# Patient Record
Sex: Female | Born: 1985 | Race: White | Hispanic: No
Health system: Southern US, Community
[De-identification: ages and names within clinical notes are randomized; demographics above are authoritative.]

## PROBLEM LIST (undated history)

## (undated) DIAGNOSIS — G43909 Migraine, unspecified, not intractable, without status migrainosus: Secondary | ICD-10-CM

## (undated) DIAGNOSIS — K5900 Constipation, unspecified: Secondary | ICD-10-CM

## (undated) HISTORY — PX: TONSILLECTOMY: SUR1361

## (undated) HISTORY — PX: SHOULDER SURGERY: SHX246

## (undated) HISTORY — PX: APPENDECTOMY: SHX54

---
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
## 2003-12-07 ENCOUNTER — Encounter: Admission: RE | Admit: 2003-12-07 | Discharge: 2003-12-07 | Payer: Self-pay | Admitting: Orthopedic Surgery

## 2004-03-13 ENCOUNTER — Emergency Department (HOSPITAL_COMMUNITY): Admission: EM | Admit: 2004-03-13 | Discharge: 2004-03-13 | Payer: Self-pay | Admitting: Emergency Medicine

## 2005-01-23 ENCOUNTER — Encounter: Admission: RE | Admit: 2005-01-23 | Discharge: 2005-01-23 | Payer: Self-pay | Admitting: Internal Medicine

## 2006-05-22 IMAGING — CT CT HEAD W/O CM
1 of 2 series · 14 of 30 positions shown, 18 images · non-contrast
Comparison: none

DUPLICATE COPY for exam association in [facility] - no change from original report, 01/25/05.
CLINICAL DATA: Sinus pain, dizziness, headache.  
 LIMITED CT OF PARANASAL SINUSES:
TECHNIQUE: Limited coronal CT images were obtained through the paranasal sinuses without intravenous contrast.
 The paranasal sinuses are well pneumatized with no present evidence of sinusitis.  The nasal turbinates are slightly prominent somewhat compromising the nasal airway.  No bony abnormality is seen.
TECHNIQUE: 5mm collimated images were obtained from the base of the skull through the vertex according to standard protocol without contrast.
 The ventricular system is normal in size and configuration and the septum is in a normal midline position.  The fourth ventricle and basilar cisterns appear normal.  No acute intracranial abnormality is seen.  No mass effect is noted.  On bone window images no bony abnormality is seen.

[Series 4: brain · axial · 0.49mm/px · z∈[+40,+171]mm · 14 of 32 slices shown, 18 images]
[im 3/32  brain]
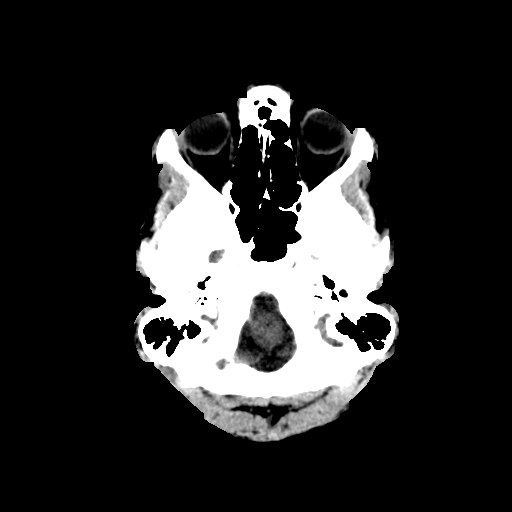
[im 3/32  bone]
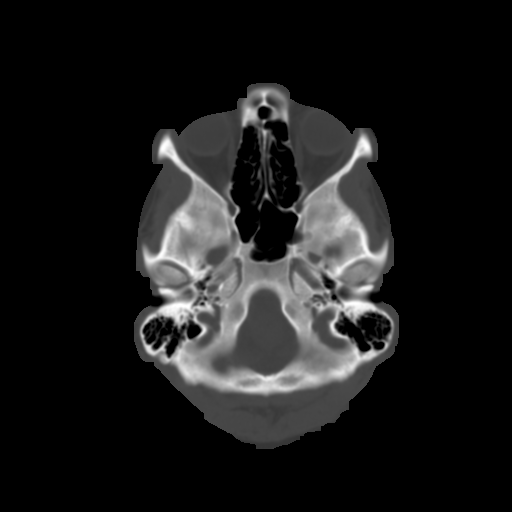
[im 5/32  brain]
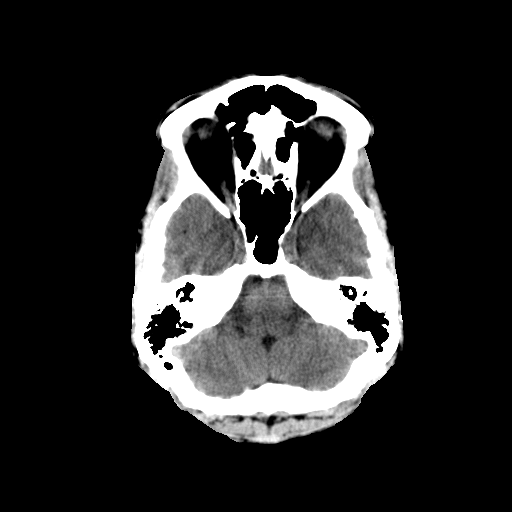
[im 7/32  brain]
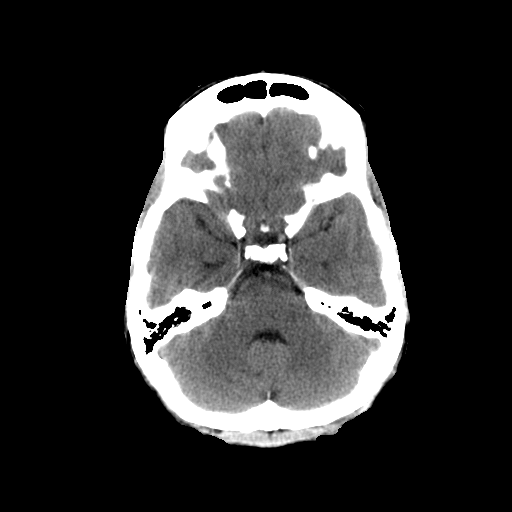
[im 9/32  brain]
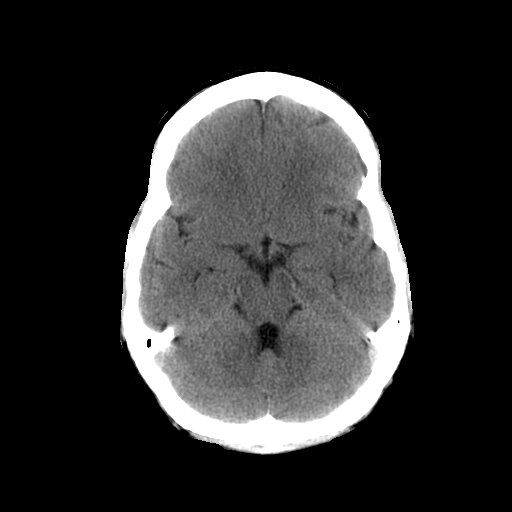
[im 11/32  brain]
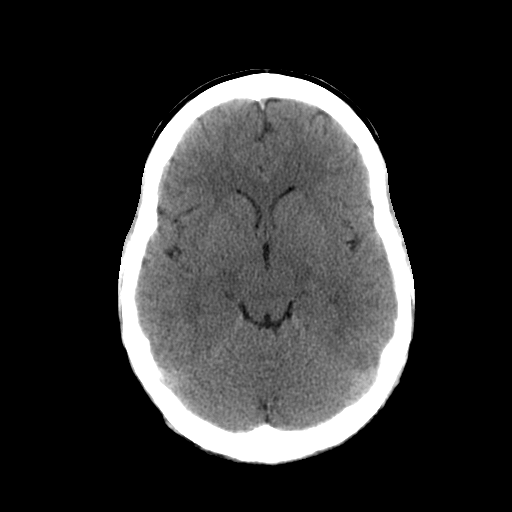
[im 11/32  bone]
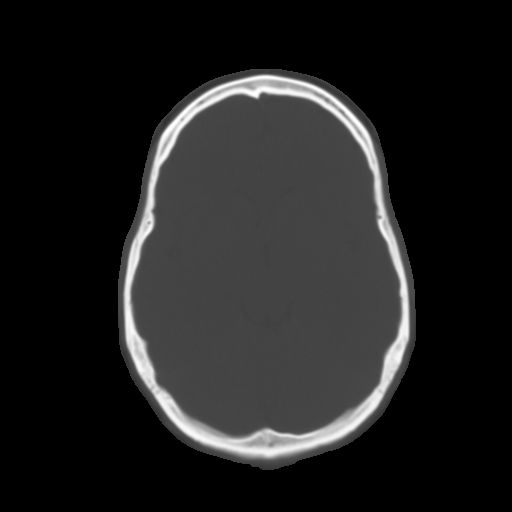
[im 13/32  brain]
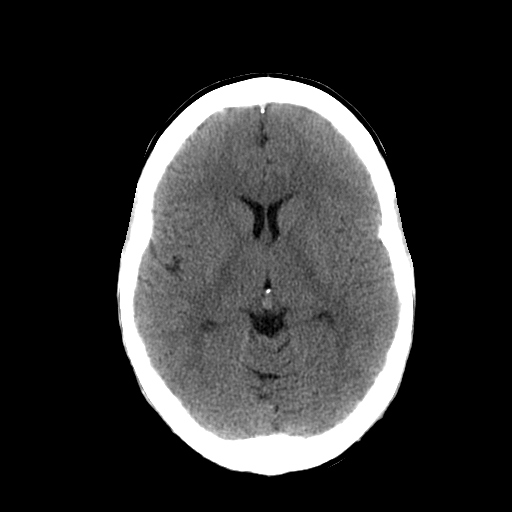
[im 15/32  brain]
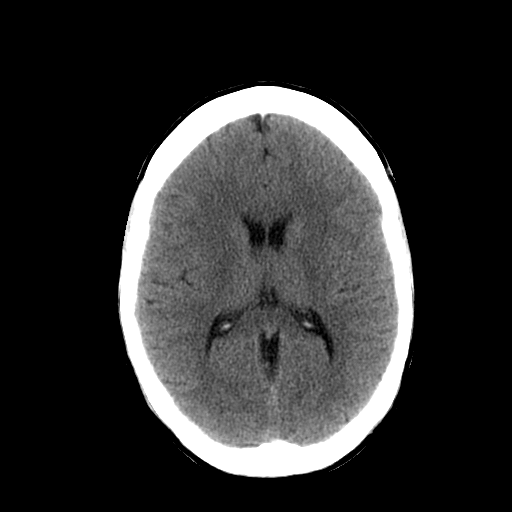
[im 17/32  brain]
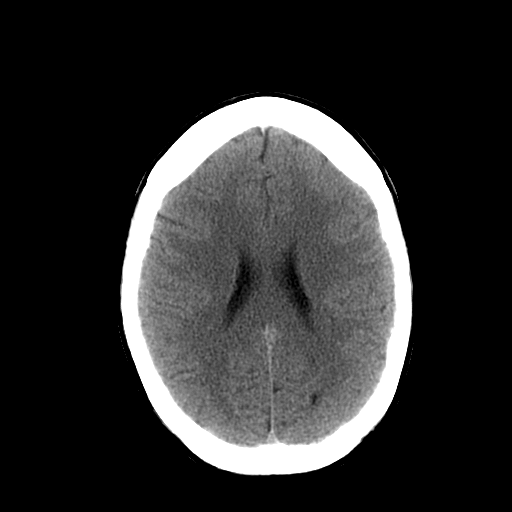
[im 19/32  brain]
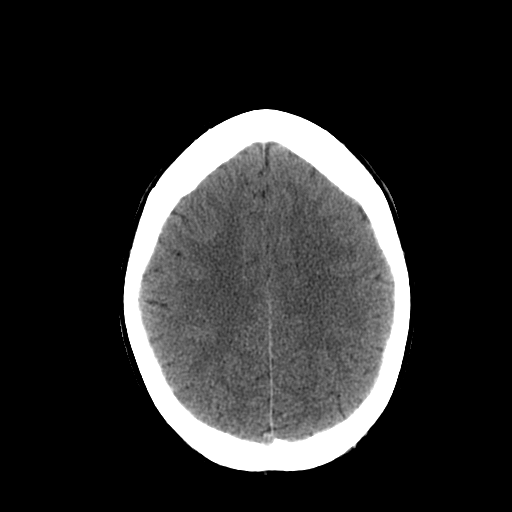
[im 19/32  bone]
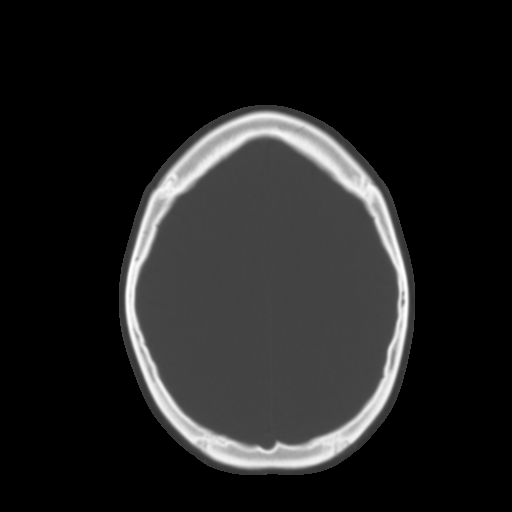
[im 21/32  brain]
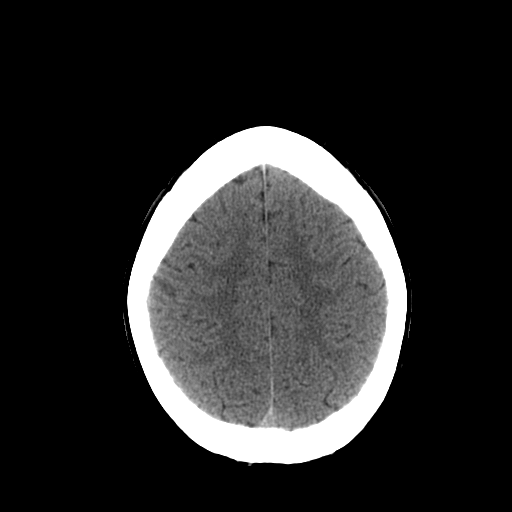
[im 23/32  brain]
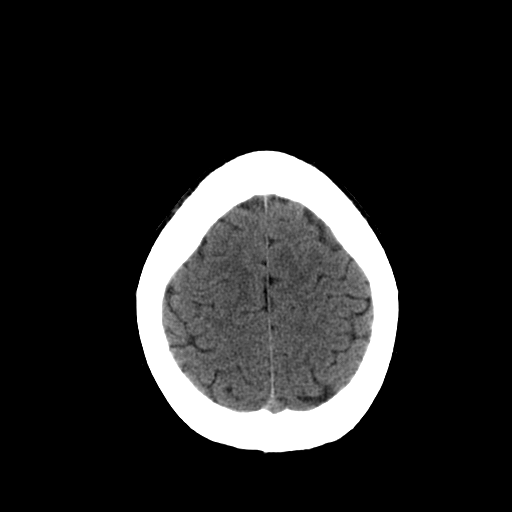
[im 25/32  brain]
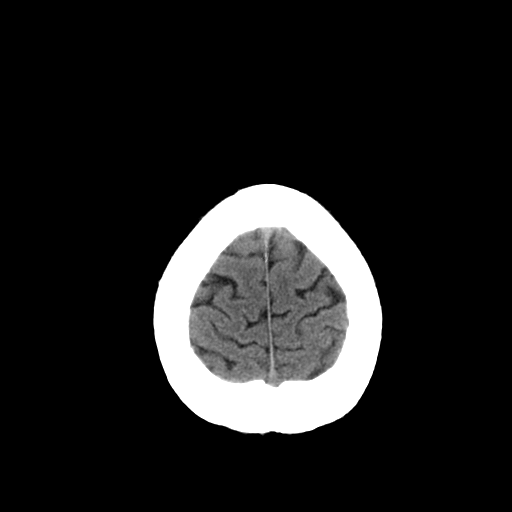
[im 27/32  brain]
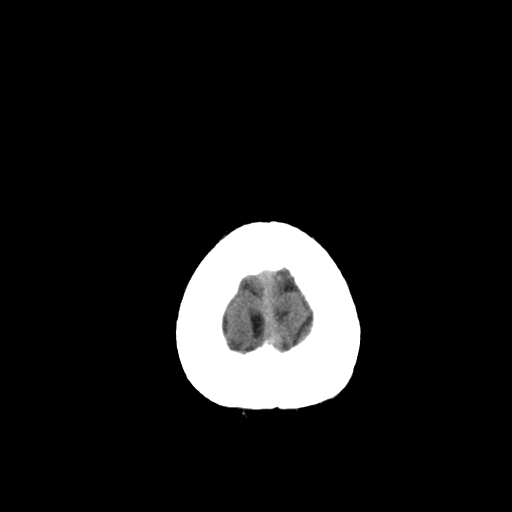
[im 27/32  bone]
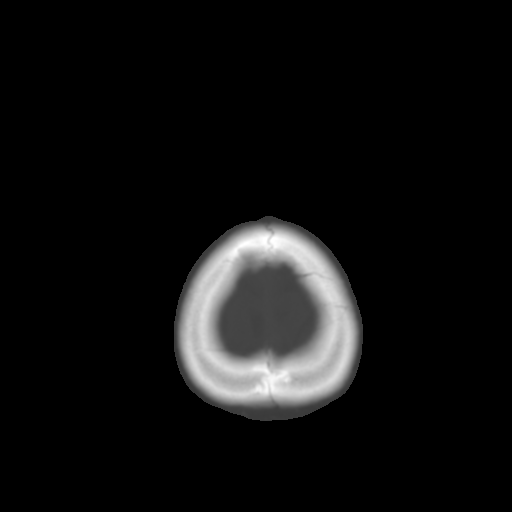
[im 29/32  brain]
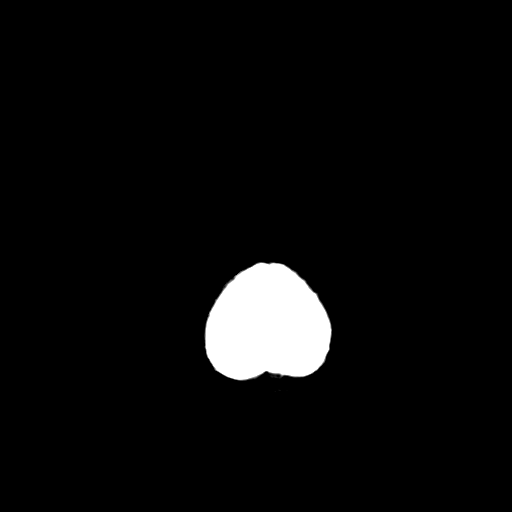

[14 of 30 positions shown; findings below may reference images not displayed]

IMPRESSION: No evidence of sinusitis.  
 HEAD CT WITHOUT CONTRAST:
IMPRESSION: Negative unenhanced CT brain scan.

## 2009-05-13 ENCOUNTER — Ambulatory Visit: Payer: Self-pay | Admitting: Diagnostic Radiology

## 2009-05-13 ENCOUNTER — Emergency Department (HOSPITAL_BASED_OUTPATIENT_CLINIC_OR_DEPARTMENT_OTHER): Admission: EM | Admit: 2009-05-13 | Discharge: 2009-05-13 | Payer: Self-pay | Admitting: Emergency Medicine

## 2013-07-27 ENCOUNTER — Other Ambulatory Visit: Payer: Self-pay | Admitting: Family Medicine

## 2013-07-27 DIAGNOSIS — K829 Disease of gallbladder, unspecified: Secondary | ICD-10-CM

## 2013-07-27 NOTE — Procedures (Signed)
°

## 2013-07-29 ENCOUNTER — Other Ambulatory Visit: Payer: BC Managed Care – PPO

## 2013-08-03 ENCOUNTER — Other Ambulatory Visit: Payer: BC Managed Care – PPO

## 2016-04-13 ENCOUNTER — Other Ambulatory Visit: Payer: Self-pay | Admitting: Family Medicine

## 2016-04-13 DIAGNOSIS — N63 Unspecified lump in unspecified breast: Secondary | ICD-10-CM

## 2016-04-16 ENCOUNTER — Ambulatory Visit
Admission: RE | Admit: 2016-04-16 | Discharge: 2016-04-16 | Disposition: A | Discharge status: Home / Self Care | Payer: BLUE CROSS/BLUE SHIELD | Source: Ambulatory Visit | Attending: Family Medicine | Admitting: Family Medicine

## 2016-04-16 DIAGNOSIS — N63 Unspecified lump in unspecified breast: Secondary | ICD-10-CM

## 2020-07-18 ENCOUNTER — Telehealth: Payer: Self-pay

## 2020-07-18 ENCOUNTER — Emergency Department (INDEPENDENT_AMBULATORY_CARE_PROVIDER_SITE_OTHER)
Admission: EM | Admit: 2020-07-18 | Discharge: 2020-07-18 | Disposition: A | Discharge status: Home / Self Care | Payer: Commercial Managed Care - PPO | Source: Home / Self Care

## 2020-07-18 ENCOUNTER — Other Ambulatory Visit: Payer: Self-pay

## 2020-07-18 DIAGNOSIS — J069 Acute upper respiratory infection, unspecified: Secondary | ICD-10-CM

## 2020-07-18 DIAGNOSIS — H6692 Otitis media, unspecified, left ear: Secondary | ICD-10-CM

## 2020-07-18 DIAGNOSIS — Z20828 Contact with and (suspected) exposure to other viral communicable diseases: Secondary | ICD-10-CM

## 2020-07-18 HISTORY — DX: Constipation, unspecified: K59.00

## 2020-07-18 HISTORY — DX: Migraine, unspecified, not intractable, without status migrainosus: G43.909

## 2020-07-18 MED ORDER — AMOXICILLIN-POT CLAVULANATE 875-125 MG PO TABS: 1.0000 | ORAL_TABLET | Freq: Two times a day (BID) | ORAL | 0 refills | Status: DC

## 2020-07-18 MED ORDER — BENZONATATE 100 MG PO CAPS: 100.0000 mg | ORAL_CAPSULE | Freq: Three times a day (TID) | ORAL | 0 refills | Status: DC

## 2020-07-18 NOTE — Discharge Instructions (Signed)

## 2020-07-18 NOTE — ED Provider Notes (Signed)
[facility]    CSN: [id number] Arrival date & time: 07/18/20  0816      History   Chief Complaint Chief Complaint  Patient presents with  . Sore Throat  . Otalgia    HPI Krista Roberts is a 35 y.o. female.   HPI Krista Roberts is a 35 y.o. female presenting to UC with c/o sore throat and bilateral ear pain for 3 days, mild congestion and cough from post-nasal drainage. She has taken mucinex and ibuprofen without relief. She notes her aunt tested positive for RSV a few weeks ago. No other known sick contacts. Pt is fully vaccinated for flu and COVID. denies fever, chills, n/v/d.    Past Medical History:  Diagnosis Date  . Constipation   . Migraine     There are no problems to display for this patient.   Past Surgical History:  Procedure Laterality Date  . APPENDECTOMY    . SHOULDER SURGERY    . TONSILLECTOMY      OB History   No obstetric history on file.      Home Medications    Prior to Admission medications   Medication Sig Start Date End Date Taking? Authorizing Provider  acetaminophen (TYLENOL) 500 MG tablet Take by mouth. 12/12/18  Yes [provider]  acyclovir (ZOVIRAX) 200 MG capsule  06/21/20  Yes [provider]  ALPRAZolam (XANAX) 0.25 MG tablet TAKE ONE TABLET (0.25 MG DOSE) BY MOUTH 3 (THREE) TIMES A DAY AS NEEDED FOR ANXIETY. 05/27/19  Yes [provider]  amoxicillin-clavulanate (AUGMENTIN) 875-125 MG tablet Take 1 tablet by mouth 2 (two) times daily. One po bid x 7 days 07/18/20  Yes Carola Viramontes, Bronwen Betters, PA-C  amphetamine-dextroamphetamine (ADDERALL XR) 20 MG 24 hr capsule Take by mouth. 06/30/20  Yes [provider]  benzonatate (TESSALON) 100 MG capsule Take 1 capsule (100 mg total) by mouth every 8 (eight) hours. 07/18/20  Yes Leeroy Cha O, PA-C  Botulinum Toxin Type A (BOTOX) 200 units SOLR Inject 200u IM every 84 days into forehead/neck muscles 11/17/18  Yes [provider]  buPROPion (WELLBUTRIN  XL) 150 MG 24 hr tablet Take 1 tablet by mouth daily. 06/28/14  Yes [provider]  clonazePAM (KLONOPIN) 0.5 MG tablet TAKE 1/2 TO 1 TAB BY MOUTH 3 TIMES DAILY 04/21/14  Yes [provider]  cyclobenzaprine (FLEXERIL) 5 MG tablet Take by mouth. 07/30/19  Yes [provider]  esomeprazole ([place]) 20 MG capsule  01/20/19  Yes [provider]  HYDROcodone-acetaminophen (NORCO/VICODIN) 5-325 MG tablet Take 1 tablet by mouth every 6 (six) hours PRN no more than twice a week must last month 11/20/17  Yes [provider]  meclizine (ANTIVERT) 25 MG tablet 1 TABLET BY MOUTH THREE TIMES A DAY AS NEEDED (PRN) 11/21/15  Yes [provider]  meloxicam (MOBIC) 15 MG tablet Take 1 tablet by mouth daily. 12/16/19  Yes [provider]  montelukast (SINGULAIR) 10 MG tablet Take 1 tablet by mouth daily. 06/25/16  Yes [provider]  norgestimate-ethinyl estradiol (ORTHO-CYCLEN) 0.25-35 MG-MCG tablet  06/25/14  Yes [provider]  promethazine (PHENERGAN) 25 MG tablet Take by mouth. 04/30/14  Yes [provider]  traZODone (DESYREL) 50 MG tablet  12/23/18  Yes [provider]  triamcinolone (KENALOG) 0.1 % paste Apply to oral ulcers as needed 2x/day 01/26/16  Yes [provider]  amphetamine-dextroamphetamine (ADDERALL XR) 20 MG 24 hr capsule Take 20 mg by mouth every morning. 07/01/20  [provider]  sertraline (ZOLOFT) 50 MG tablet Take by mouth.    [provider]    Family History Family History  Problem Relation Age of Onset  . Migraines Mother   . Migraines Father   . Skin cancer Father     Social History Social History   Tobacco Use  . Smoking status: Never Smoker  . Smokeless tobacco: Never Used  Vaping Use  . Vaping Use: Never used  Substance Use Topics  . Alcohol use: Yes    Comment: occasionally  . Drug use: Never     Allergies   Patient has no known  allergies.   Review of Systems Review of Systems  Constitutional: Negative for chills and fever.  HENT: Positive for congestion, ear pain and sore throat. Negative for trouble swallowing and voice change.   Respiratory: Positive for cough. Negative for shortness of breath.   Cardiovascular: Negative for chest pain and palpitations.  Gastrointestinal: Negative for abdominal pain, diarrhea, nausea and vomiting.  Musculoskeletal: Negative for arthralgias, back pain and myalgias.  Skin: Negative for rash.  Neurological: Negative for dizziness, light-headedness and headaches.  All other systems reviewed and are negative.    Physical Exam Triage Vital Signs ED Triage Vitals  Enc Vitals Group     BP 07/18/20 0834 107/69     Pulse Rate 07/18/20 0834 88     Resp 07/18/20 0834 17     Temp 07/18/20 0834 99.7 F (37.6 C)     Temp Source 07/18/20 0834 Oral     SpO2 07/18/20 0834 98 %     Weight 07/18/20 0828 162 lb (73.5 kg)     Height 07/18/20 0828 5\' 10"  (1.778 m)     Head Circumference --      Peak Flow --      Pain Score 07/18/20 0827 6     Pain Loc --      Pain Edu? --      Excl. in GC? --    No data found.  Updated Vital Signs BP 107/69 (BP Location: Left Arm)   Pulse 88   Temp 99.7 F (37.6 C) (Oral)   Resp 17   Ht 5\' 10"  (1.778 m)   Wt 162 lb (73.5 kg)   LMP 07/04/2020 (Approximate)   SpO2 98%   BMI 23.24 kg/m   Visual Acuity Right Eye Distance:   Left Eye Distance:   Bilateral Distance:    Right Eye Near:   Left Eye Near:    Bilateral Near:     Physical Exam Vitals and nursing note reviewed.  Constitutional:      General: She is not in acute distress.    Appearance: She is well-developed and well-nourished. She is not ill-appearing, toxic-appearing or diaphoretic.  HENT:     Head: Normocephalic and atraumatic.     Right Ear: Tympanic membrane and ear canal normal.     Left Ear: Ear canal normal. Tympanic membrane is erythematous. Tympanic membrane is  not bulging.     Nose: Nose normal.     Right Sinus: No maxillary sinus tenderness or frontal sinus tenderness.     Left Sinus: No maxillary sinus tenderness or frontal sinus tenderness.     Mouth/Throat:     Lips: Pink.     Mouth: Mucous membranes are moist.     Pharynx: Oropharynx is clear. Uvula midline.  Eyes:     Extraocular Movements: EOM normal.  Cardiovascular:     Rate and  Rhythm: Normal rate and regular rhythm.  Pulmonary:     Effort: Pulmonary effort is normal. No respiratory distress.     Breath sounds: Normal breath sounds. No stridor. No wheezing, rhonchi or rales.  Musculoskeletal:        General: Normal range of motion.     Cervical back: Normal range of motion and neck supple.  Lymphadenopathy:     Cervical: No cervical adenopathy.  Skin:    General: Skin is warm and dry.  Neurological:     Mental Status: She is alert and oriented to person, place, and time.  Psychiatric:        Mood and Affect: Mood and affect normal.        Behavior: Behavior normal.      UC Treatments / Results  Labs (all labs ordered are listed, but only abnormal results are displayed) Labs Reviewed  COVID-19, FLU A+B AND RSV    EKG   Radiology No results found.  Procedures Procedures (including critical care time)  Medications Ordered in UC Medications - No data to display  Initial Impression / Assessment and Plan / UC Course  I have reviewed the triage vital signs and the nursing notes.  Pertinent labs & imaging results that were available during my care of the patient were reviewed by me and considered in my medical decision making (see chart for details).     Hx and exam c/w Left AOM secondary to URI Exposure to RSV a few weeks ago, pt requesting testing. COVID/Flu/RSV test pending AVS given  Final Clinical Impressions(s) / UC Diagnoses   Final diagnoses:  Upper respiratory tract infection, unspecified type  Exposure to respiratory syncytial virus (RSV)  Left  acute otitis media     Discharge Instructions      Please take antibiotics as prescribed and be sure to complete entire course even if you start to feel better to ensure infection does not come back.  You may take 500mg  acetaminophen every 4-6 hours or in combination with ibuprofen 400-600mg  every 6-8 hours as needed for pain, inflammation, and fever.  Be sure to well hydrated with clear liquids and get at least 8 hours of sleep at night, preferably more while sick.   Please follow up with family medicine in 1 week if needed.     ED Prescriptions    Medication Sig Dispense Auth. Provider   benzonatate (TESSALON) 100 MG capsule Take 1 capsule (100 mg total) by mouth every 8 (eight) hours. 21 capsule O, PA-C   amoxicillin-clavulanate (AUGMENTIN) 875-125 MG tablet Take 1 tablet by mouth 2 (two) times daily. One po bid x 7 days 14 tablet Waylan Rocher, Lurene Shadow     PDMP not reviewed this encounter.   [place], Lurene Shadow 07/18/20 1041

## 2020-07-18 NOTE — ED Triage Notes (Signed)
Patient presents to Urgent Care with complaints of sore throat and bilateral otalgia x3 days. Patient reports no improvement. She is fully vaccinated for flu and covid. OTC mucinex and ibuprofen.

## 2020-07-20 LAB — COVID-19, FLU A+B AND RSV
Influenza A, NAA: NOT DETECTED
Influenza B, NAA: NOT DETECTED
RSV, NAA: DETECTED — AB
SARS-CoV-2, NAA: NOT DETECTED

## 2020-08-16 ENCOUNTER — Emergency Department (INDEPENDENT_AMBULATORY_CARE_PROVIDER_SITE_OTHER)
Admission: EM | Admit: 2020-08-16 | Discharge: 2020-08-16 | Disposition: A | Discharge status: Home / Self Care | Payer: Commercial Managed Care - PPO | Source: Home / Self Care

## 2020-08-16 ENCOUNTER — Other Ambulatory Visit: Payer: Self-pay

## 2020-08-16 ENCOUNTER — Encounter: Payer: Self-pay | Admitting: Emergency Medicine

## 2020-08-16 DIAGNOSIS — J069 Acute upper respiratory infection, unspecified: Secondary | ICD-10-CM

## 2020-08-16 DIAGNOSIS — U071 COVID-19: Secondary | ICD-10-CM

## 2020-08-16 DIAGNOSIS — R0602 Shortness of breath: Secondary | ICD-10-CM

## 2020-08-16 DIAGNOSIS — R509 Fever, unspecified: Secondary | ICD-10-CM

## 2020-08-16 LAB — POCT INFLUENZA A/B
Influenza A, POC: NEGATIVE
Influenza B, POC: NEGATIVE

## 2020-08-16 LAB — POC SARS CORONAVIRUS 2 AG -  ED: SARS Coronavirus 2 Ag: POSITIVE — AB

## 2020-08-16 MED ORDER — ALBUTEROL SULFATE HFA 108 (90 BASE) MCG/ACT IN AERS: 1.0000 | INHALATION_SPRAY | Freq: Four times a day (QID) | RESPIRATORY_TRACT | 0 refills | Status: AC | PRN

## 2020-08-16 MED ORDER — PREDNISONE 20 MG PO TABS: 40.0000 mg | ORAL_TABLET | Freq: Every day | ORAL | 0 refills | Status: DC

## 2020-08-16 MED ORDER — BENZONATATE 100 MG PO CAPS: 100.0000 mg | ORAL_CAPSULE | Freq: Three times a day (TID) | ORAL | 0 refills | Status: AC | PRN

## 2020-08-16 NOTE — ED Triage Notes (Signed)
Fever x 3 days, had RSV at th beginning of January, hard to breath, cough, fatigue.

## 2020-08-16 NOTE — ED Provider Notes (Signed)
[facility]    CSN: [id number] Arrival date & time: 08/16/20  1033      History   Chief Complaint Chief Complaint  Patient presents with  . Fever    HPI Krista Roberts is a 35 y.o. female.   HPI  Patient presents with URI symptoms including cough, shortness of breath, fatigue, and fever x3 days.  Endorses fatigue and having shortness of breath since her diagnosis of RSV the early part of January.  She had an albuterol inhaler however ran out of medication and request a refill.  Unknown of COVID exposure. Denies worrisome symptoms of shortness of breath, weakness, N&V, chest pain or leg pain.  Past Medical History:  Diagnosis Date  . Constipation   . Migraine     There are no problems to display for this patient.   Past Surgical History:  Procedure Laterality Date  . APPENDECTOMY    . SHOULDER SURGERY    . TONSILLECTOMY      OB History   No obstetric history on file.      Home Medications    Prior to Admission medications   Medication Sig Start Date End Date Taking? Authorizing Provider  acetaminophen (TYLENOL) 500 MG tablet Take by mouth. 12/12/18   [provider]  ALPRAZolam (XANAX) 0.25 MG tablet TAKE ONE TABLET (0.25 MG DOSE) BY MOUTH 3 (THREE) TIMES A DAY AS NEEDED FOR ANXIETY. 05/27/19   [provider]  amphetamine-dextroamphetamine (ADDERALL XR) 20 MG 24 hr capsule Take by mouth. 06/30/20   [provider]  amphetamine-dextroamphetamine (ADDERALL XR) 20 MG 24 hr capsule Take 20 mg by mouth every morning. 07/01/20   [provider]  benzonatate (TESSALON) 100 MG capsule Take 1 capsule (100 mg total) by mouth every 8 (eight) hours. 07/18/20   Lurene Shadow, PA-C  Botulinum Toxin Type A (BOTOX) 200 units SOLR Inject 200u IM every 84 days into forehead/neck muscles 11/17/18   [provider]  buPROPion (WELLBUTRIN XL) 150 MG 24 hr tablet Take 1 tablet by mouth daily. 06/28/14   [provider]   clonazePAM (KLONOPIN) 0.5 MG tablet TAKE 1/2 TO 1 TAB BY MOUTH 3 TIMES DAILY 04/21/14   [provider]  cyclobenzaprine (FLEXERIL) 5 MG tablet Take by mouth. 07/30/19   [provider]  esomeprazole ([place]) 20 MG capsule  01/20/19   [provider]  HYDROcodone-acetaminophen (NORCO/VICODIN) 5-325 MG tablet Take 1 tablet by mouth every 6 (six) hours PRN no more than twice a week must last month 11/20/17   [provider]  meclizine (ANTIVERT) 25 MG tablet 1 TABLET BY MOUTH THREE TIMES A DAY AS NEEDED (PRN) 11/21/15   [provider]  meloxicam (MOBIC) 15 MG tablet Take 1 tablet by mouth daily. 12/16/19   [provider]  montelukast (SINGULAIR) 10 MG tablet Take 1 tablet by mouth daily. 06/25/16   [provider]  norgestimate-ethinyl estradiol (ORTHO-CYCLEN) 0.25-35 MG-MCG tablet  06/25/14   [provider]  promethazine (PHENERGAN) 25 MG tablet Take by mouth. 04/30/14   [provider]  sertraline (ZOLOFT) 50 MG tablet Take by mouth.    [provider]  traZODone (DESYREL) 50 MG tablet  12/23/18   [provider]  triamcinolone (KENALOG) 0.1 % paste Apply to oral ulcers as needed 2x/day 01/26/16   [provider]    Family History Family History  Problem Relation Age of Onset  . Migraines Mother   . Migraines Father   .  Skin cancer Father     Social History Social History   Tobacco Use  . Smoking status: Never Smoker  . Smokeless tobacco: Never Used  Vaping Use  . Vaping Use: Never used  Substance Use Topics  . Alcohol use: Yes    Comment: occasionally  . Drug use: Never     Allergies   Patient has no known allergies.  Review of Systems Review of Systems Pertinent negatives listed in HPI Physical Exam Triage Vital Signs ED Triage Vitals  Enc Vitals Group     BP 08/16/20 1049 104/68     Pulse Rate 08/16/20 1049 76     Resp 08/16/20 1049 18     Temp 08/16/20 1049 98.1 F  (36.7 C)     Temp Source 08/16/20 1049 Oral     SpO2 08/16/20 1049 98 %     Weight 08/16/20 1050 163 lb (73.9 kg)     Height 08/16/20 1050 5\' 10"  (1.778 m)     Head Circumference --      Peak Flow --      Pain Score 08/16/20 1049 0     Pain Loc --      Pain Edu? --      Excl. in GC? --    No data found.  Updated Vital Signs BP 104/68 (BP Location: Right Arm)   Pulse 76   Temp 98.1 F (36.7 C) (Oral)   Resp 18   Ht 5\' 10"  (1.778 m)   Wt 163 lb (73.9 kg)   SpO2 98%   BMI 23.39 kg/m   Visual Acuity Right Eye Distance:   Left Eye Distance:   Bilateral Distance:    Right Eye Near:   Left Eye Near:    Bilateral Near:     Physical Exam  General Appearance:    Alert, non-acutely-ill-appearing,cooperative, no distress  HENT:   Normocephalic, ears normal, nares mucosal edema with congestion, rhinorrhea, oropharynx    Eyes:    PERRL, conjunctiva/corneas clear, EOM's intact       Lungs:     Clear to auscultation bilaterally, respirations unlabored  Heart:    Regular rate and rhythm  Neurologic:   Awake, alert, oriented x 3. No apparent focal neurological           defect.      UC Treatments / Results  Labs (all labs ordered are listed, but only abnormal results are displayed) Labs Reviewed - No data to display  EKG   Radiology No results found.  Procedures Procedures (including critical care time)  Medications Ordered in UC Medications - No data to display  Initial Impression / Assessment and Plan / UC Course  I have reviewed the triage vital signs and the nursing notes.  Pertinent labs & imaging results that were available during my care of the patient were reviewed by me and considered in my medical decision making (see chart for details).    Point-of-care Covid test is positive point-of-care flu was negative. Your COVID 19 results should result within 3-5 days. Alternate Tylenol and ibuprofen as needed for body aches and fever. Continue symptom  management.  Treatment per discharge medication and discharge instructions.  Symptom management per recommendations discussed today.  If any breathing difficulty or chest pain develops go immediately to the closest emergency department for evaluation. Final Clinical Impressions(s) / UC Diagnoses   Final diagnoses:  Viral URI  Encounter for screening for COVID-19  Fever, unspecified  Shortness of breath  Discharge Instructions   None    ED Prescriptions    Medication Sig Dispense Auth. Provider   albuterol (VENTOLIN HFA) 108 (90 Base) MCG/ACT inhaler Inhale 1-2 puffs into the lungs every 6 (six) hours as needed for wheezing or shortness of breath. 8 g Bing Neighbors, FNP   benzonatate (TESSALON) 100 MG capsule Take 1-2 capsules (100-200 mg total) by mouth 3 (three) times daily as needed for cough. 40 capsule Bing Neighbors, FNP   predniSONE (DELTASONE) 20 MG tablet Take 2 tablets (40 mg total) by mouth daily with breakfast. 10 tablet Bing Neighbors, FNP     PDMP not reviewed this encounter.   Bing Neighbors, FNP 08/16/20 1128

## 2020-08-16 NOTE — Discharge Instructions (Signed)
Your test for COVID-19 was positive, meaning that you were infected with the novel coronavirus and could give the germ to others.  Please continue isolation at home for at least 5 days since the start of your symptoms.  Once you complete your 5 day quarantine, you may return to normal activities as long as you've not had a fever for over 24 hours(without taking fever reducing medicine) and your symptoms are improving. You should wear a mask. Please continue good preventive care measures, including:  frequent hand-washing, avoid touching your face, cover coughs/sneezes, stay out of crowds and keep a 6 foot distance from others.  Go to the nearest hospital emergency room if fever/cough/breathlessness are severe or illness seems like a threat to life.

## 2020-08-17 ENCOUNTER — Telehealth: Payer: Self-pay

## 2020-08-17 NOTE — Telephone Encounter (Signed)
Called to discuss with patient about COVID-19 symptoms and the use of one of the available treatments for those with mild to moderate Covid symptoms and at a high risk of hospitalization.  Pt appears to qualify for outpatient treatment due to co-morbid conditions and/or a member of an at-risk group in accordance with the FDA Emergency Use Authorization.    Symptom onset: 08/13/20 per chart Vaccinated: Unknown Booster? Unknown Immunocompromised? No Qualifiers: None  Unable to reach pt - No answer.  Krista Roberts

## 2023-08-18 ENCOUNTER — Other Ambulatory Visit: Payer: Self-pay

## 2023-08-18 ENCOUNTER — Ambulatory Visit
Admission: RE | Admit: 2023-08-18 | Discharge: 2023-08-18 | Disposition: A | Discharge status: Home / Self Care | Payer: BLUE CROSS/BLUE SHIELD | Source: Ambulatory Visit | Attending: Family Medicine | Admitting: Family Medicine

## 2023-08-18 ENCOUNTER — Ambulatory Visit: Payer: Self-pay

## 2023-08-18 VITALS — BP 104/70 | HR 100 | Temp 99.1°F | Resp 16

## 2023-08-18 DIAGNOSIS — J209 Acute bronchitis, unspecified: Secondary | ICD-10-CM

## 2023-08-18 LAB — POCT INFLUENZA A/B
Influenza A, POC: NEGATIVE
Influenza B, POC: NEGATIVE

## 2023-08-18 MED ORDER — PREDNISONE 20 MG PO TABS
40.0000 mg | ORAL_TABLET | Freq: Every day | ORAL | 0 refills | Status: AC
Start: 2023-08-18 — End: ?

## 2023-08-18 NOTE — Discharge Instructions (Signed)
Flu test is negative.  Suspect you have a viral bronchitis secondary to recurrent sinus infection that your primary care doctor is currently treating you for.  I am prescribing prednisone take 40 mg once daily for 5 days.  Continue to hydrate well with fluids.  Complete entire course of antibiotics that you are currently prescribed.  Follow-up as needed.

## 2023-08-18 NOTE — ED Provider Notes (Signed)
[facility]    CSN: [id number] Arrival date & time: 08/18/23  1037      History   Chief Complaint Chief Complaint  Patient presents with   Cough    Entered by patient    HPI Krista Roberts is a 38 y.o. female.  Patient presents with a 3-day history of fever, sore throat, chest congestion, cough.  Patient is currently being treated with Biaxin for a sinus infection present over 2 weeks.  Patient has also been taking over-the-counter Tylenol and Motrin for management of fever without significant improvement of symptoms. Reports increase effort with breathing without overt shortness of breath or wheezing. Past Medical History:  Diagnosis Date   Constipation    Migraine     There are no active problems to display for this patient.   Past Surgical History:  Procedure Laterality Date   APPENDECTOMY     CESAREAN SECTION     SHOULDER SURGERY     TONSILLECTOMY      OB History   No obstetric history on file.      Home Medications    Prior to Admission medications   Medication Sig Start Date End Date Taking? Authorizing Provider  predniSONE (DELTASONE) 20 MG tablet Take 2 tablets (40 mg total) by mouth daily with breakfast. 08/18/23  Yes Bing Neighbors, NP  acetaminophen (TYLENOL) 500 MG tablet Take by mouth. 12/12/18   [provider]  albuterol (VENTOLIN HFA) 108 (90 Base) MCG/ACT inhaler Inhale 1-2 puffs into the lungs every 6 (six) hours as needed for wheezing or shortness of breath. 08/16/20   Bing Neighbors, NP  ALPRAZolam Prudy Feeler) 0.25 MG tablet TAKE ONE TABLET (0.25 MG DOSE) BY MOUTH 3 (THREE) TIMES A DAY AS NEEDED FOR ANXIETY. 05/27/19   [provider]  amphetamine-dextroamphetamine (ADDERALL XR) 20 MG 24 hr capsule Take by mouth. 06/30/20   [provider]  amphetamine-dextroamphetamine (ADDERALL XR) 20 MG 24 hr capsule Take 20 mg by mouth every morning. 07/01/20   [provider]  benzonatate (TESSALON) 100 MG capsule  Take 1-2 capsules (100-200 mg total) by mouth 3 (three) times daily as needed for cough. 08/16/20   Bing Neighbors, NP  Botulinum Toxin Type A (BOTOX) 200 units SOLR Inject 200u IM every 84 days into forehead/neck muscles 11/17/18   [provider]  buPROPion (WELLBUTRIN XL) 150 MG 24 hr tablet Take 1 tablet by mouth daily. 06/28/14   [provider]  clonazePAM (KLONOPIN) 0.5 MG tablet TAKE 1/2 TO 1 TAB BY MOUTH 3 TIMES DAILY 04/21/14   [provider]  cyclobenzaprine (FLEXERIL) 5 MG tablet Take by mouth. 07/30/19   [provider]  esomeprazole (NEXIUM) 20 MG capsule  01/20/19   [provider]  HYDROcodone-acetaminophen (NORCO/VICODIN) 5-325 MG tablet Take 1 tablet by mouth every 6 (six) hours PRN no more than twice a week must last month 11/20/17   [provider]  meclizine (ANTIVERT) 25 MG tablet 1 TABLET BY MOUTH THREE TIMES A DAY AS NEEDED (PRN) 11/21/15   [provider]  montelukast (SINGULAIR) 10 MG tablet Take 1 tablet by mouth daily. 06/25/16   [provider]  norgestimate-ethinyl estradiol (ORTHO-CYCLEN) 0.25-35 MG-MCG tablet  06/25/14   [provider]  promethazine (PHENERGAN) 25 MG tablet Take by mouth. 04/30/14   [provider]  traZODone (DESYREL) 50 MG tablet  12/23/18   [provider]  triamcinolone (KENALOG) 0.1 % paste Apply to oral ulcers as needed  2x/day 01/26/16   [provider]    Family History Family History  Problem Relation Age of Onset   Migraines Mother    Migraines Father    Skin cancer Father     Social History Social History   Tobacco Use   Smoking status: Never   Smokeless tobacco: Never  Vaping Use   Vaping status: Never Used  Substance Use Topics   Alcohol use: Yes    Comment: occasionally   Drug use: Never     Allergies   Patient has no known allergies.   Review of Systems Review of Systems  Respiratory:  Positive for cough.       Physical Exam Triage Vital Signs ED Triage Vitals  Encounter Vitals Group     BP 08/18/23 1103 104/70     Systolic BP Percentile --      Diastolic BP Percentile --      Pulse Rate 08/18/23 1103 100     Resp 08/18/23 1103 16     Temp 08/18/23 1103 99.1 F (37.3 C)     Temp Source 08/18/23 1103 Oral     SpO2 08/18/23 1103 98 %     Weight --      Height --      Head Circumference --      Peak Flow --      Pain Score 08/18/23 1106 7     Pain Loc --      Pain Education --      Exclude from Growth Chart --    No data found.  Updated Vital Signs BP 104/70   Pulse 100   Temp 99.1 F (37.3 C) (Oral)   Resp 16   LMP 08/12/2023 (Approximate)   SpO2 98%   Visual Acuity Right Eye Distance:   Left Eye Distance:   Bilateral Distance:    Right Eye Near:   Left Eye Near:    Bilateral Near:     Physical Exam Vitals reviewed.  Constitutional:      Appearance: Normal appearance.  HENT:     Head: Normocephalic and atraumatic.     Right Ear: Tympanic membrane, ear canal and external ear normal.     Left Ear: Tympanic membrane, ear canal and external ear normal.     Nose: Congestion and rhinorrhea present.  Eyes:     Extraocular Movements: Extraocular movements intact.     Conjunctiva/sclera: Conjunctivae normal.     Pupils: Pupils are equal, round, and reactive to light.  Cardiovascular:     Rate and Rhythm: Normal rate and regular rhythm.  Pulmonary:     Effort: Pulmonary effort is normal.     Breath sounds: Rhonchi present.  Musculoskeletal:        General: Normal range of motion.     Cervical back: Normal range of motion and neck supple.  Skin:    General: Skin is warm and dry.     Capillary Refill: Capillary refill takes less than 2 seconds.  Neurological:     General: No focal deficit present.     Mental Status: She is alert and oriented to person, place, and time.      UC Treatments / Results  Labs (all labs ordered are listed, but only abnormal  results are displayed) Labs Reviewed  POCT INFLUENZA A/B    EKG   Radiology No results found.  Procedures Procedures (including critical care time)  Medications Ordered in UC Medications - No data to display  Initial  Impression / Assessment and Plan / UC Course  I have reviewed the triage vital signs and the nursing notes.  Pertinent labs & imaging results that were available during my care of the patient were reviewed by me and considered in my medical decision making (see chart for details).    Acute Bronchitis, start prednisone 40 mg daily x 5 days. POC Flu test is negative. Tylenol for fever and headache. Continue treatment previously prescribed by PCP for sinus infection  Return precautions if symptoms worsen or do not improve. Final Clinical Impressions(s) / UC Diagnoses   Final diagnoses:  Acute bronchitis, unspecified organism     Discharge Instructions      Flu test is negative.  Suspect you have a viral bronchitis secondary to recurrent sinus infection that your primary care doctor is currently treating you for.  I am prescribing prednisone take 40 mg once daily for 5 days.  Continue to hydrate well with fluids.  Complete entire course of antibiotics that you are currently prescribed.  Follow-up as needed.     ED Prescriptions     Medication Sig Dispense Auth. Provider   predniSONE (DELTASONE) 20 MG tablet Take 2 tablets (40 mg total) by mouth daily with breakfast. 10 tablet Bing Neighbors, NP      PDMP not reviewed this encounter.   Bing Neighbors, NP 08/19/23 [phone]

## 2023-08-18 NOTE — ED Triage Notes (Signed)
Has been ill for 3 days, has fever, sore throat, chest congestion, cough (prod of brown sputum). Is being treated for sinus infection - on biaxin. Has had otc tylenol, motrin.
